# Patient Record
Sex: Female | Born: 1965 | Race: White | Hispanic: No | Marital: Married | Smoking: Never smoker
Health system: Southern US, Community
[De-identification: ages and names within clinical notes are randomized; demographics above are authoritative.]

## PROBLEM LIST (undated history)

## (undated) DIAGNOSIS — E079 Disorder of thyroid, unspecified: Secondary | ICD-10-CM

---

## 2000-07-10 ENCOUNTER — Other Ambulatory Visit: Admission: RE | Admit: 2000-07-10 | Discharge: 2000-07-10 | Payer: Self-pay | Admitting: Obstetrics and Gynecology

## 2000-08-09 ENCOUNTER — Encounter: Payer: Self-pay | Admitting: Obstetrics and Gynecology

## 2000-08-09 ENCOUNTER — Ambulatory Visit (HOSPITAL_COMMUNITY): Admission: RE | Admit: 2000-08-09 | Discharge: 2000-08-09 | Payer: Self-pay | Admitting: Obstetrics and Gynecology

## 2001-08-26 ENCOUNTER — Other Ambulatory Visit: Admission: RE | Admit: 2001-08-26 | Discharge: 2001-08-26 | Payer: Self-pay | Admitting: Gynecology

## 2001-09-12 ENCOUNTER — Ambulatory Visit (HOSPITAL_COMMUNITY): Admission: RE | Admit: 2001-09-12 | Discharge: 2001-09-12 | Payer: Self-pay | Admitting: Gynecology

## 2001-09-12 ENCOUNTER — Encounter: Payer: Self-pay | Admitting: Gynecology

## 2002-03-27 ENCOUNTER — Encounter: Payer: Self-pay | Admitting: Family Medicine

## 2002-03-27 ENCOUNTER — Encounter: Admission: RE | Admit: 2002-03-27 | Discharge: 2002-03-27 | Payer: Self-pay | Admitting: Family Medicine

## 2002-10-01 ENCOUNTER — Encounter: Payer: Self-pay | Admitting: Family Medicine

## 2002-10-01 ENCOUNTER — Encounter: Admission: RE | Admit: 2002-10-01 | Discharge: 2002-10-01 | Payer: Self-pay | Admitting: Family Medicine

## 2003-04-01 ENCOUNTER — Inpatient Hospital Stay (HOSPITAL_COMMUNITY): Admission: AD | Admit: 2003-04-01 | Discharge: 2003-04-01 | Payer: Self-pay | Admitting: Gynecology

## 2004-01-03 ENCOUNTER — Other Ambulatory Visit: Admission: RE | Admit: 2004-01-03 | Discharge: 2004-01-03 | Payer: Self-pay | Admitting: Gynecology

## 2005-07-04 ENCOUNTER — Other Ambulatory Visit: Admission: RE | Admit: 2005-07-04 | Discharge: 2005-07-04 | Payer: Self-pay | Admitting: Gynecology

## 2006-12-10 ENCOUNTER — Other Ambulatory Visit: Admission: RE | Admit: 2006-12-10 | Discharge: 2006-12-10 | Payer: Self-pay | Admitting: Gynecology

## 2007-12-04 HISTORY — PX: SALPINGECTOMY: SHX328

## 2008-02-09 ENCOUNTER — Other Ambulatory Visit: Admission: RE | Admit: 2008-02-09 | Discharge: 2008-02-09 | Payer: Self-pay | Admitting: *Deleted

## 2008-05-04 ENCOUNTER — Encounter (INDEPENDENT_AMBULATORY_CARE_PROVIDER_SITE_OTHER): Payer: Self-pay | Admitting: Obstetrics and Gynecology

## 2008-05-04 ENCOUNTER — Observation Stay (HOSPITAL_COMMUNITY): Admission: AD | Admit: 2008-05-04 | Discharge: 2008-05-06 | Payer: Self-pay | Admitting: Obstetrics and Gynecology

## 2009-04-09 ENCOUNTER — Inpatient Hospital Stay (HOSPITAL_COMMUNITY): Admission: AD | Admit: 2009-04-09 | Discharge: 2009-04-10 | Payer: Self-pay | Admitting: Obstetrics and Gynecology

## 2010-12-24 ENCOUNTER — Encounter: Payer: Self-pay | Admitting: Gynecology

## 2011-03-13 LAB — CBC
HCT: 29.4 % — ABNORMAL LOW (ref 36.0–46.0)
Hemoglobin: 10.2 g/dL — ABNORMAL LOW (ref 12.0–15.0)
MCHC: 34.8 g/dL (ref 30.0–36.0)
MCV: 92.6 fL (ref 78.0–100.0)
Platelets: 233 10*3/uL (ref 150–400)
RBC: 3.18 MIL/uL — ABNORMAL LOW (ref 3.87–5.11)
RDW: 14.4 % (ref 11.5–15.5)
WBC: 14.7 10*3/uL — ABNORMAL HIGH (ref 4.0–10.5)

## 2011-03-13 LAB — RPR: RPR Ser Ql: NONREACTIVE

## 2011-04-17 NOTE — Op Note (Signed)
Brittney Hogan, Brittney Hogan                ACCOUNT NO.:  [id number]   MEDICAL RECORD NO.:  [id number]          PATIENT TYPE:  OBV   LOCATION:  9306                          FACILITY:  [facility]   PHYSICIAN:  Miguel Aschoff, M.D.       DATE OF BIRTH:  1966/06/20   DATE OF PROCEDURE:  05/04/2008  DATE OF DISCHARGE:                               OPERATIVE REPORT   PREOPERATIVE DIAGNOSIS:  Hemoperitoneum, left ectopic pregnancy.   POSTOPERATIVE DIAGNOSIS:  Hemoperitoneum, left ectopic pregnancy.   PROCEDURE:  Laparoscopy with left partial salpingectomy.   SURGEON:  Miguel Aschoff, MD   ANESTHESIA:  General.   COMPLICATIONS:  None.   JUSTIFICATION:  The patient is a 45 year old white female gravida 8,  para 0-0-7 by Dr. Chevis Pretty who was being evaluated for possible ectopic  pregnancy.  On evaluation by Dr. Chevis Pretty, the patient was found to have a  mass in the left adnexa consistent with an ectopic pregnancy.  In  addition, she was noted to have free fluid in the abdomen consistent  with hemoperitoneum and signs of symptoms consistent with leaking  ectopic pregnancy.  Because of this, she was transferred to the triage  area at [facility] to undergo laparoscopy and laparotomy, if  indicated.  The risks and benefits of the procedure were discussed with  the patient including possibility of laparotomy and loss of the left  tube.  Informed consent has been obtained.   PROCEDURE IN DETAIL:  The patient was taken to the operating placed in  the supine position.  General anesthesia was administered without  difficulty.  She was then placed in dorsal lithotomy position, prepped  and draped in the usual sterile fashion.  A Foley catheter was inserted.  Prior to inserting Foley catheter, examination under anesthesia revealed  normal external genitalia, normal Bartholin Skene glands, and normal  urethra.  The vaginal vault was without gross lesion.  The cervix was  without gross lesion.  Uterus was noted be normal  size and shape and  anterior.  The adnexa revealed no definite masses.  There was a  fullness, however, noted on the left.  Once this was done, the speculum  was placed in the vaginal vault.  A Hulka tenaculum placed through the  cervix and held.  Attention was then directed to the umbilicus where a  small infraumbilical incision was made.  Veress needle was then inserted  and the abdomen was insufflated with 3 L of CO2 under monitored  pressure.  After the insufflation, trocar to the laparoscope was placed  followed by laparoscope itself.  It was obvious upon entering the  abdomen that there was free blood in the abdominal cavity and the  pelvis.  Approximately, 200 mL of blood were found.  Inspection revealed  the uterus to be anterior, normal size, and shape.  The right tube was  normal along its course, then removed fine and delicate except for a  small peritubal cyst noted on the right side approximately 4 mm in size.  The right ovary was noted to be within normal limits.  On  the left side,  there was a fusiform mass noted in the distal one-third of the left  tube, which was oozing blood.  In addition, there were adhesions of the  appendices epiploica to the left ovary.  At this point, 2 accessory  ports were established under direct visualization.  A 5-mm port was  established in the right lower quadrant and a 10-mm port in the left  lower quadrant.  At this point, the ectopic pregnancy was grasped, and  then using a gyrus unit, the tube was transected and then dissected  along the mesosalpinx, excising the ectopic pregnancy in the distal  portion of the left tube.  This was done with an excellent hemostasis  and the specimen freed without difficulty.  At this point, an EndoCatch  unit was placed into the abdomen and the ectopic pregnancy placed into  the EndoCatch and brought up to the peritoneal surfaces and in the left  lower quadrant.  The specimen was then held in this location  while the  abdomen was irrigated with saline to evacuate the blood that has been  present, secondary to the ectopic pregnancy.  This was done without  difficulty and the vast majority of all the intraperitoneal blood was  removed without difficulty.   Final inspection was then made for hemostasis.  Hemostasis appeared to  be excellent.  At this point, the ectopic pregnancy was brought through  the left lower quadrant incision without difficulty.  At this point, CO2  was allowed to escape.  The small incisions were then closed using  subcuticular 4-0 Vicryl.  The port sites were then injected with 0.25%  Marcaine.  A total of 12 mL was used and then a subcuticular 4-0 Vicryl  closure, closing the small defects, and Steri-Strips were applied.  The  total estimated blood loss from the both the ectopic pregnancy and the  surgery was less than 200 mL.  There was minimal blood loss from the  surgical procedure itself.  At this point, the patient was reversed from  the anesthetic and taken to the recovery room in satisfactory condition.   The plan is for the patient be observed overnight and if stable on the  morning of May 05, 2008, the patient will be discharged to home.      Miguel Aschoff, M.D.  Electronically Signed     AR/MEDQ  D:  05/04/2008  T:  05/05/2008  Job:  [id number]   cc:   Leatha Gilding. Mezer, M.D.  Fax: [phone]

## 2011-04-17 NOTE — Discharge Summary (Signed)
Brittney Hogan, Brittney Hogan                ACCOUNT NO.:  [id number]   MEDICAL RECORD NO.:  [id number]          PATIENT TYPE:  INP   LOCATION:  9124                          FACILITY:  [facility]   PHYSICIAN:  Sherron Monday, MD        DATE OF BIRTH:  Jul 11, 1966   DATE OF ADMISSION:  04/09/2009  DATE OF DISCHARGE:  04/10/2009                               DISCHARGE SUMMARY   ADMITTING DIAGNOSIS:  Intrauterine pregnancy at term in active labor.   DISCHARGE DIAGNOSIS:  Intrauterine pregnancy at term in active labor,  delivered via spontaneous vaginal delivery.   HISTORY OF PRESENT ILLNESS:  A 45 year old G7, P 0-0-6-0 at 1 and 0  weeks, who presents in active labor.  On presentation to labor and  delivery she was complete, complete and 0 station.  Labor commenced at 2-  3:00 a.m. with regular painful contractions.  She had rupture of  membranes at 4:00 a.m. for clear fluid.  She had good fetal movement.  Her pregnancy was complicated by AMA and first trimester screen with  elevated Down syndrome risk of 1 in 190.   PAST MEDICAL HISTORY:  Not  Significant   PAST SURGICAL HISTORY:  Significant for salpingectomy.   PAST OBSTETRICS AND GYNECOLOGIC HISTORY:  G1 was an ectopic pregnancy  treated with methotrexate.  G2 through G5 were first trimester,  miscarriages.  G6, left ectopic treated with a salpingectomy.  G7 is the  present pregnancy.  No history of any abnormal Pap smears or sexually  transmitted diseases.   MEDICATIONS:  Prenatal vitamins.   ALLERGIES:  SULFA and STADOL.   SOCIAL HISTORY:  Denies alcohol, tobacco, or drug use.  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   FAMILY HISTORY:  Significant for cardiac arrhythmia in a paternal uncle  and a brother.   PRENATAL LABORATORY FINDINGS:  Hemoglobin 12.1, platelets 287,000, O  positive, antibody screen negative, Pap smear is in normal limits,  gonorrhea negative, chlamydia negative, RPR nonreactive, rubella immune,  cystic  fibrosis screen negative, hepatitis B surface antigen negative,  HIV negative, hemoglobin A1c of 5.8, first trimester screen with an  increased risk of Down syndrome, AFP within normal limits, early Glucola  was 106, Glucola at 28 weeks was 164 with a followup GTT of 92, 189, 152  and 120.  AFP as previously mentioned within normal limits.  Her group B  strep was resolved for not back yet.  Ultrasound performed on November  12 plus weeks revealed normal nuchal fold.  On November 19, 2008,  confirming [facility] of Apr 24, 2009, 17 plus 6 weeks, revealed normal  anatomy, anterior placenta.   On physical exam, afebrile.  Vital signs stable on presentation, a  benign exam.  Fetal heart tones in the 130s and reassuring with  contractions every 45 minutes.  On my arrival, the exam was 10 cm  dilated by 100% effaced and +2 station.  As her GBS was unknown and she  was progressing rapidly, a delivery was planned.  She pushed for  approximately 20 minutes to deliver a  viable female infant, ROA  presentation at 6:46 a.m. with Apgars of 9 at 1 minute and 10 at 5  minutes, and a weight of 6 pounds 14 ounces.  Placenta was expressed  intact at 6:56.  OB lacerations were periclitoral although hemostatic,  and a second-degree perineal repair with 3-0 Vicryl in a typical fashion  with a local block.  EBL was less than 500 mL.  On postpartum day #1,  the patient requested discharge to home.  At this time, she had no  complaints.  Her pain was well controlled.  She had normal lochia, was  tolerating p.o. and ambulating well.  Her postpartum hemoglobin was  10.2.  Her abdomen was soft and her fundus was firm and nontender.  She  was discharged home with routine discharge instructions and numbers to  call if any questions or problems.  She was discharged with  prescriptions for Motrin, Darvocet, and prenatal vitamins.  She will  follow up in 6 weeks.   DISCHARGE INFORMATION:  She is 0 positive, rubella immune.   Plans to  breast feed.  We will discuss contraception at her 6-week visit and her  postpartum hemoglobin was 10.2.      Sherron Monday, MD  Electronically Signed     JB/MEDQ  D:  04/10/2009  T:  04/10/2009  Job:  [id number]

## 2011-04-20 NOTE — Discharge Summary (Signed)
Brittney Hogan, Brittney Hogan                ACCOUNT NO.:  [id number]   MEDICAL RECORD NO.:  [id number]          PATIENT TYPE:  OBV   LOCATION:  9306                          [facility]ACILITY[facility]   PHYSICIAN:  Miguel Aschoff, M.D.       DATE OF BIRTH:  [birth year]   DATE OF ADMISSION:  05/04/2008  DATE OF DISCHARGE:  05/06/2008                               DISCHARGE SUMMARY   ADMISSION DIAGNOSIS:  Ectopic pregnancy.   FINAL DIAGNOSIS:  Ectopic pregnancy.   OPERATIONS AND PROCEDURE:  Laparoscopy with partial left salpingectomy.   ANESTHESIA:  General anesthesia.   BRIEF HISTORY:  The patient is a 45 year old white female gravida 8,  para 0-0-7-0 was seen by Dr. Chevis Pretty and found to have an ectopic  pregnancy.  She was referred for treatment to the [facility]  office, at which time, she was noted to have signs of a hemoperitoneum  and was transferred to [facility] to undergo laparoscopy and  laparotomy if indicated.   HOSPITAL COURSE:  Preoperative studies were obtained.  This revealed an  admission hemoglobin of 11.8 and white count of 6600.  Chemistry profile  showed slight elevation of glucose of 123.   HOSPITAL COURSE:  Under general anesthesia, a laparoscopy was carried  out, at which time she was noted to have a left ectopic pregnancy which  was actively bleeding at one-third of her left fallopian tube.  At this  point, the laparoscopic partial salpingectomy was carried out and the  specimen was excised.  The patient tolerated the surgery and procedure  well.  On the first postoperative day, the patient did have significant  nausea and was continued in the hospital for 1 additional day on IV  therapy until the nausea resolved.  She was then discharged home on  05/06/2008, in a satisfactory condition.   Medications for home include Darvocet-N 100, 1 q.4 h. as needed for  pain.  She was instructed to do no heavy lifting, place nothing in the  vagina for 2 weeks and to call for any  problems such as fever, pain, or  heavy bleeding or issues with her incision.  She is to be seen in the  office for 4 weeks for followup examination.  1      Miguel Aschoff, M.D.  Electronically Signed     AR/MEDQ  D:  06/03/2008  T:  06/03/2008  Job:  [id number]

## 2011-08-30 LAB — CBC
HCT: 29.1 — ABNORMAL LOW
HCT: 32.9 — ABNORMAL LOW
HCT: 34.7 — ABNORMAL LOW
Hemoglobin: 10.3 — ABNORMAL LOW
Hemoglobin: 11.2 — ABNORMAL LOW
Hemoglobin: 11.8 — ABNORMAL LOW
MCHC: 34
MCHC: 34
MCHC: 35.2
MCV: 89.1
MCV: 89.7
MCV: 90.6
Platelets: 255
Platelets: 336
Platelets: 339
RBC: 3.27 — ABNORMAL LOW
RBC: 3.64 — ABNORMAL LOW
RBC: 3.86 — ABNORMAL LOW
RDW: 13.1
RDW: 13.2
RDW: 13.2
WBC: 11.7 — ABNORMAL HIGH
WBC: 6.6
WBC: 8.9

## 2011-08-30 LAB — COMPREHENSIVE METABOLIC PANEL
ALT: 15
AST: 21
Albumin: 3.3 — ABNORMAL LOW
Alkaline Phosphatase: 31 — ABNORMAL LOW
BUN: 7
CO2: 29
Calcium: 8.2 — ABNORMAL LOW
Chloride: 101
Creatinine, Ser: 1.02
GFR calc Af Amer: >60
GFR calc non Af Amer: 59 — ABNORMAL LOW
Glucose, Bld: 123 — ABNORMAL HIGH
Potassium: 3.6
Sodium: 140
Total Bilirubin: 1.1
Total Protein: 5.8 — ABNORMAL LOW

## 2011-08-30 LAB — ABO/RH: ABO/RH(D): O POS

## 2011-08-30 LAB — TYPE AND SCREEN
ABO/RH(D): O POS
Antibody Screen: NEGATIVE

## 2015-06-02 ENCOUNTER — Other Ambulatory Visit (HOSPITAL_COMMUNITY): Payer: Self-pay | Admitting: Chiropractic Medicine

## 2015-06-02 ENCOUNTER — Ambulatory Visit (HOSPITAL_COMMUNITY)
Admission: RE | Admit: 2015-06-02 | Discharge: 2015-06-02 | Disposition: A | Discharge status: Home / Self Care | Payer: BLUE CROSS/BLUE SHIELD | Source: Ambulatory Visit | Attending: Chiropractic Medicine | Admitting: Chiropractic Medicine

## 2015-06-02 DIAGNOSIS — M542 Cervicalgia: Secondary | ICD-10-CM | POA: Insufficient documentation

## 2015-06-02 DIAGNOSIS — M25512 Pain in left shoulder: Secondary | ICD-10-CM

## 2016-12-14 ENCOUNTER — Other Ambulatory Visit: Payer: Self-pay | Admitting: Physician Assistant

## 2016-12-14 DIAGNOSIS — Z1231 Encounter for screening mammogram for malignant neoplasm of breast: Secondary | ICD-10-CM

## 2016-12-28 ENCOUNTER — Ambulatory Visit
Admission: RE | Admit: 2016-12-28 | Discharge: 2016-12-28 | Disposition: A | Discharge status: Home / Self Care | Payer: BLUE CROSS/BLUE SHIELD | Source: Ambulatory Visit | Attending: Physician Assistant | Admitting: Physician Assistant

## 2016-12-28 DIAGNOSIS — Z1231 Encounter for screening mammogram for malignant neoplasm of breast: Secondary | ICD-10-CM

## 2016-12-31 ENCOUNTER — Other Ambulatory Visit: Payer: Self-pay | Admitting: Physician Assistant

## 2016-12-31 DIAGNOSIS — R928 Other abnormal and inconclusive findings on diagnostic imaging of breast: Secondary | ICD-10-CM

## 2017-04-16 ENCOUNTER — Other Ambulatory Visit: Payer: BLUE CROSS/BLUE SHIELD

## 2017-04-19 ENCOUNTER — Ambulatory Visit
Admission: RE | Admit: 2017-04-19 | Discharge: 2017-04-19 | Disposition: A | Discharge status: Home / Self Care | Payer: BLUE CROSS/BLUE SHIELD | Source: Ambulatory Visit | Attending: Physician Assistant | Admitting: Physician Assistant

## 2017-04-19 DIAGNOSIS — R928 Other abnormal and inconclusive findings on diagnostic imaging of breast: Secondary | ICD-10-CM

## 2020-01-29 DIAGNOSIS — E063 Autoimmune thyroiditis: Secondary | ICD-10-CM | POA: Diagnosis not present

## 2020-01-29 DIAGNOSIS — E782 Mixed hyperlipidemia: Secondary | ICD-10-CM | POA: Diagnosis not present

## 2020-01-29 DIAGNOSIS — E8881 Metabolic syndrome: Secondary | ICD-10-CM | POA: Diagnosis not present

## 2020-01-29 DIAGNOSIS — E7212 Methylenetetrahydrofolate reductase deficiency: Secondary | ICD-10-CM | POA: Diagnosis not present

## 2020-01-29 DIAGNOSIS — R5383 Other fatigue: Secondary | ICD-10-CM | POA: Diagnosis not present

## 2020-01-29 DIAGNOSIS — E039 Hypothyroidism, unspecified: Secondary | ICD-10-CM | POA: Diagnosis not present

## 2020-01-29 DIAGNOSIS — E559 Vitamin D deficiency, unspecified: Secondary | ICD-10-CM | POA: Diagnosis not present

## 2020-05-26 DIAGNOSIS — N951 Menopausal and female climacteric states: Secondary | ICD-10-CM | POA: Diagnosis not present

## 2020-05-26 DIAGNOSIS — E279 Disorder of adrenal gland, unspecified: Secondary | ICD-10-CM | POA: Diagnosis not present

## 2020-05-26 DIAGNOSIS — E039 Hypothyroidism, unspecified: Secondary | ICD-10-CM | POA: Diagnosis not present

## 2020-05-26 DIAGNOSIS — E782 Mixed hyperlipidemia: Secondary | ICD-10-CM | POA: Diagnosis not present

## 2020-05-26 DIAGNOSIS — E8881 Metabolic syndrome: Secondary | ICD-10-CM | POA: Diagnosis not present

## 2020-05-26 DIAGNOSIS — R7301 Impaired fasting glucose: Secondary | ICD-10-CM | POA: Diagnosis not present

## 2020-07-19 ENCOUNTER — Ambulatory Visit (INDEPENDENT_AMBULATORY_CARE_PROVIDER_SITE_OTHER): Payer: BC Managed Care – PPO | Admitting: Sports Medicine

## 2020-07-19 ENCOUNTER — Other Ambulatory Visit: Payer: Self-pay

## 2020-07-19 ENCOUNTER — Ambulatory Visit
Admission: RE | Admit: 2020-07-19 | Discharge: 2020-07-19 | Disposition: A | Discharge status: Home / Self Care | Payer: BC Managed Care – PPO | Source: Ambulatory Visit | Attending: Sports Medicine | Admitting: Sports Medicine

## 2020-07-19 VITALS — BP 124/82 | Ht 62.0 in | Wt 170.0 lb

## 2020-07-19 DIAGNOSIS — M5412 Radiculopathy, cervical region: Secondary | ICD-10-CM

## 2020-07-19 DIAGNOSIS — R202 Paresthesia of skin: Secondary | ICD-10-CM

## 2020-07-19 DIAGNOSIS — M25512 Pain in left shoulder: Secondary | ICD-10-CM | POA: Diagnosis not present

## 2020-07-19 DIAGNOSIS — R531 Weakness: Secondary | ICD-10-CM | POA: Diagnosis not present

## 2020-07-19 DIAGNOSIS — M542 Cervicalgia: Secondary | ICD-10-CM | POA: Diagnosis not present

## 2020-07-19 MED ORDER — DICLOFENAC SODIUM 75 MG PO TBEC: 75.0000 mg | DELAYED_RELEASE_TABLET | Freq: Two times a day (BID) | ORAL | 0 refills | Status: DC | PRN
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
## 2020-07-20 ENCOUNTER — Encounter: Payer: Self-pay | Admitting: Sports Medicine

## 2020-07-26 ENCOUNTER — Other Ambulatory Visit: Payer: Self-pay

## 2020-07-26 ENCOUNTER — Ambulatory Visit (INDEPENDENT_AMBULATORY_CARE_PROVIDER_SITE_OTHER): Payer: BC Managed Care – PPO | Admitting: Sports Medicine

## 2020-07-26 VITALS — BP 116/74 | Ht 62.0 in | Wt 170.0 lb

## 2020-07-26 DIAGNOSIS — M25512 Pain in left shoulder: Secondary | ICD-10-CM
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
## 2020-07-27 NOTE — Addendum Note (Signed)
Addended by: Reino Bellis R on: 07/27/2020 09:11 AM   Modules accepted: Level of Service

## 2020-08-04 DIAGNOSIS — Z79890 Hormone replacement therapy (postmenopausal): Secondary | ICD-10-CM | POA: Diagnosis not present

## 2020-08-04 DIAGNOSIS — E559 Vitamin D deficiency, unspecified: Secondary | ICD-10-CM | POA: Diagnosis not present

## 2020-08-04 DIAGNOSIS — R7303 Prediabetes: Secondary | ICD-10-CM | POA: Diagnosis not present

## 2020-08-04 DIAGNOSIS — E039 Hypothyroidism, unspecified: Secondary | ICD-10-CM | POA: Diagnosis not present

## 2020-08-04 DIAGNOSIS — R7982 Elevated C-reactive protein (CRP): Secondary | ICD-10-CM | POA: Diagnosis not present

## 2020-08-04 DIAGNOSIS — R5383 Other fatigue: Secondary | ICD-10-CM | POA: Diagnosis not present

## 2020-08-04 DIAGNOSIS — R635 Abnormal weight gain: Secondary | ICD-10-CM | POA: Diagnosis not present

## 2020-08-04 DIAGNOSIS — E782 Mixed hyperlipidemia: Secondary | ICD-10-CM | POA: Diagnosis not present

## 2020-08-04 DIAGNOSIS — M2550 Pain in unspecified joint: Secondary | ICD-10-CM | POA: Diagnosis not present

## 2020-08-09 DIAGNOSIS — M25512 Pain in left shoulder: Secondary | ICD-10-CM | POA: Diagnosis not present

## 2020-08-09 DIAGNOSIS — M67814 Other specified disorders of tendon, left shoulder: Secondary | ICD-10-CM | POA: Diagnosis not present

## 2020-08-10 ENCOUNTER — Ambulatory Visit: Payer: BC Managed Care – PPO | Admitting: Physical Therapy

## 2020-08-11 DIAGNOSIS — M67814 Other specified disorders of tendon, left shoulder: Secondary | ICD-10-CM | POA: Diagnosis not present

## 2020-08-11 DIAGNOSIS — M25512 Pain in left shoulder: Secondary | ICD-10-CM | POA: Diagnosis not present

## 2020-08-16 DIAGNOSIS — M25512 Pain in left shoulder: Secondary | ICD-10-CM | POA: Diagnosis not present

## 2020-08-16 DIAGNOSIS — M67814 Other specified disorders of tendon, left shoulder: Secondary | ICD-10-CM | POA: Diagnosis not present

## 2020-08-18 DIAGNOSIS — M25512 Pain in left shoulder: Secondary | ICD-10-CM | POA: Diagnosis not present

## 2020-08-18 DIAGNOSIS — M67814 Other specified disorders of tendon, left shoulder: Secondary | ICD-10-CM | POA: Diagnosis not present

## 2020-08-22 DIAGNOSIS — M25512 Pain in left shoulder: Secondary | ICD-10-CM | POA: Diagnosis not present

## 2020-08-22 DIAGNOSIS — M67814 Other specified disorders of tendon, left shoulder: Secondary | ICD-10-CM | POA: Diagnosis not present

## 2020-08-23 ENCOUNTER — Ambulatory Visit (INDEPENDENT_AMBULATORY_CARE_PROVIDER_SITE_OTHER): Payer: BC Managed Care – PPO | Admitting: Sports Medicine

## 2020-08-23 ENCOUNTER — Other Ambulatory Visit: Payer: Self-pay

## 2020-08-23 VITALS — BP 128/84 | Ht 62.0 in | Wt 168.0 lb

## 2020-08-23 DIAGNOSIS — M791 Myalgia, unspecified site: Secondary | ICD-10-CM | POA: Diagnosis not present

## 2020-08-23 DIAGNOSIS — M25512 Pain in left shoulder: Secondary | ICD-10-CM | POA: Diagnosis not present

## 2020-08-24 LAB — SEDIMENTATION RATE: Sed Rate: 40 mm/hr (ref 0–40)

## 2020-08-24 NOTE — Progress Notes (Signed)
   Subjective:    Patient ID: Brittney Hogan, female    DOB: [birth year], 54 y.o.   MRN: [id number]  HPI   Patient comes in today for follow-up on chronic left shoulder pain.  She has been working with physical therapy and making some improvement.  She has also been working with a Land.  He has ordered an MRI of her left shoulder.  Although she has noticed improvement with physical therapy, her pain is still quite severe.  She is also complaining of diffuse myalgias throughout her body.  She recently had some blood work done and those results are available through epic.  She has mild hypercholesterolemia as well as some insulin resistance.  She is aware of these facts.  Main reason for the blood work was to make sure that she did not have a systemic reason for her myalgias.    Review of Systems As above    Objective:   Physical Exam  Well-developed, well-nourished.  Left shoulder: Patient has nearly full active range of motion.  Rotator cuff strength is 5/5 but she does have reproducible pain with resisted supraspinatus.  Neurovascularly intact distally.       Assessment & Plan:   Persistent chronic left shoulder pain-rule out rotator cuff tear  Previous bedside ultrasound showed tendinopathy of the supraspinatus tendon.  Although she has made some initial improvement with physical therapy, I agree with the MRI that was ordered by her chiropractor.  We will delineate further treatment based on those findings.  In the meantime, I did check a sed rate today which was within normal limits so suspicion of a systemic etiology for her myalgias is low.  Of note, she is also continuing to complain of bilateral hand numbness and tingling.  She has an appointment with neurology next month.

## 2020-08-25 DIAGNOSIS — M25512 Pain in left shoulder: Secondary | ICD-10-CM | POA: Diagnosis not present

## 2020-08-25 DIAGNOSIS — M67814 Other specified disorders of tendon, left shoulder: Secondary | ICD-10-CM | POA: Diagnosis not present

## 2020-08-30 ENCOUNTER — Encounter: Payer: Self-pay | Admitting: Sports Medicine

## 2020-08-30 DIAGNOSIS — M25512 Pain in left shoulder: Secondary | ICD-10-CM | POA: Diagnosis not present

## 2020-08-30 DIAGNOSIS — M67814 Other specified disorders of tendon, left shoulder: Secondary | ICD-10-CM | POA: Diagnosis not present

## 2020-09-01 DIAGNOSIS — M25512 Pain in left shoulder: Secondary | ICD-10-CM | POA: Diagnosis not present

## 2020-09-01 DIAGNOSIS — M67814 Other specified disorders of tendon, left shoulder: Secondary | ICD-10-CM | POA: Diagnosis not present

## 2020-09-06 DIAGNOSIS — M67814 Other specified disorders of tendon, left shoulder: Secondary | ICD-10-CM | POA: Diagnosis not present

## 2020-09-06 DIAGNOSIS — M25512 Pain in left shoulder: Secondary | ICD-10-CM | POA: Diagnosis not present

## 2020-09-08 DIAGNOSIS — M25512 Pain in left shoulder: Secondary | ICD-10-CM | POA: Diagnosis not present

## 2020-09-08 DIAGNOSIS — M67814 Other specified disorders of tendon, left shoulder: Secondary | ICD-10-CM | POA: Diagnosis not present

## 2020-09-13 ENCOUNTER — Other Ambulatory Visit: Payer: Self-pay

## 2020-09-13 DIAGNOSIS — M67814 Other specified disorders of tendon, left shoulder: Secondary | ICD-10-CM | POA: Diagnosis not present

## 2020-09-13 DIAGNOSIS — M25512 Pain in left shoulder: Secondary | ICD-10-CM | POA: Diagnosis not present

## 2020-09-13 MED ORDER — DICLOFENAC SODIUM 75 MG PO TBEC: 75.0000 mg | DELAYED_RELEASE_TABLET | Freq: Two times a day (BID) | ORAL | 0 refills | Status: DC | PRN

## 2020-09-13 NOTE — Progress Notes (Signed)
Pt called asking for refill on diclofenac (VOLTAREN) 75 MG EC tablet.

## 2020-09-15 DIAGNOSIS — M67814 Other specified disorders of tendon, left shoulder: Secondary | ICD-10-CM | POA: Diagnosis not present

## 2020-09-15 DIAGNOSIS — M25512 Pain in left shoulder: Secondary | ICD-10-CM | POA: Diagnosis not present

## 2020-09-20 DIAGNOSIS — M67814 Other specified disorders of tendon, left shoulder: Secondary | ICD-10-CM | POA: Diagnosis not present

## 2020-09-20 DIAGNOSIS — M25512 Pain in left shoulder: Secondary | ICD-10-CM | POA: Diagnosis not present

## 2020-09-22 DIAGNOSIS — M67814 Other specified disorders of tendon, left shoulder: Secondary | ICD-10-CM | POA: Diagnosis not present

## 2020-09-22 DIAGNOSIS — M25512 Pain in left shoulder: Secondary | ICD-10-CM | POA: Diagnosis not present

## 2020-09-26 DIAGNOSIS — M25512 Pain in left shoulder: Secondary | ICD-10-CM | POA: Diagnosis not present

## 2020-09-26 DIAGNOSIS — M67814 Other specified disorders of tendon, left shoulder: Secondary | ICD-10-CM | POA: Diagnosis not present

## 2020-09-27 ENCOUNTER — Ambulatory Visit (INDEPENDENT_AMBULATORY_CARE_PROVIDER_SITE_OTHER): Payer: BC Managed Care – PPO | Admitting: Neurology

## 2020-09-27 ENCOUNTER — Other Ambulatory Visit: Payer: Self-pay

## 2020-09-27 ENCOUNTER — Encounter: Payer: Self-pay | Admitting: Neurology

## 2020-09-27 VITALS — BP 120/80 | HR 76 | Ht 62.0 in | Wt 164.5 lb

## 2020-09-27 DIAGNOSIS — M79602 Pain in left arm: Secondary | ICD-10-CM | POA: Diagnosis not present

## 2020-09-27 DIAGNOSIS — M25512 Pain in left shoulder: Secondary | ICD-10-CM

## 2020-09-27 DIAGNOSIS — R200 Anesthesia of skin: Secondary | ICD-10-CM | POA: Insufficient documentation

## 2020-09-27 DIAGNOSIS — M542 Cervicalgia: Secondary | ICD-10-CM | POA: Insufficient documentation

## 2020-09-27 MED ORDER — METHYLPREDNISOLONE 4 MG PO TABS: ORAL_TABLET | ORAL | 0 refills | Status: DC
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
## 2020-09-29 DIAGNOSIS — M25512 Pain in left shoulder: Secondary | ICD-10-CM | POA: Diagnosis not present

## 2020-09-29 DIAGNOSIS — M67814 Other specified disorders of tendon, left shoulder: Secondary | ICD-10-CM | POA: Diagnosis not present

## 2020-10-05 DIAGNOSIS — M25512 Pain in left shoulder: Secondary | ICD-10-CM | POA: Diagnosis not present

## 2020-10-05 DIAGNOSIS — M67814 Other specified disorders of tendon, left shoulder: Secondary | ICD-10-CM | POA: Diagnosis not present

## 2020-10-06 DIAGNOSIS — M67814 Other specified disorders of tendon, left shoulder: Secondary | ICD-10-CM | POA: Diagnosis not present

## 2020-10-06 DIAGNOSIS — M25512 Pain in left shoulder: Secondary | ICD-10-CM | POA: Diagnosis not present

## 2020-10-07 DIAGNOSIS — M2560 Stiffness of unspecified joint, not elsewhere classified: Secondary | ICD-10-CM | POA: Diagnosis not present

## 2020-10-07 DIAGNOSIS — M47812 Spondylosis without myelopathy or radiculopathy, cervical region: Secondary | ICD-10-CM | POA: Diagnosis not present

## 2020-10-07 DIAGNOSIS — M47813 Spondylosis without myelopathy or radiculopathy, cervicothoracic region: Secondary | ICD-10-CM | POA: Diagnosis not present

## 2020-10-07 DIAGNOSIS — M25512 Pain in left shoulder: Secondary | ICD-10-CM | POA: Diagnosis not present

## 2020-10-07 DIAGNOSIS — M50222 Other cervical disc displacement at C5-C6 level: Secondary | ICD-10-CM | POA: Diagnosis not present

## 2020-10-07 DIAGNOSIS — M4802 Spinal stenosis, cervical region: Secondary | ICD-10-CM | POA: Diagnosis not present

## 2020-10-10 DIAGNOSIS — M67814 Other specified disorders of tendon, left shoulder: Secondary | ICD-10-CM | POA: Diagnosis not present

## 2020-10-10 DIAGNOSIS — M25512 Pain in left shoulder: Secondary | ICD-10-CM | POA: Diagnosis not present

## 2020-10-11 ENCOUNTER — Ambulatory Visit (INDEPENDENT_AMBULATORY_CARE_PROVIDER_SITE_OTHER): Payer: BC Managed Care – PPO | Admitting: Neurology

## 2020-10-11 ENCOUNTER — Encounter (INDEPENDENT_AMBULATORY_CARE_PROVIDER_SITE_OTHER): Payer: BC Managed Care – PPO | Admitting: Neurology

## 2020-10-11 ENCOUNTER — Encounter: Payer: Self-pay | Admitting: Neurology

## 2020-10-11 DIAGNOSIS — Z0289 Encounter for other administrative examinations: Secondary | ICD-10-CM

## 2020-10-11 DIAGNOSIS — R200 Anesthesia of skin: Secondary | ICD-10-CM | POA: Diagnosis not present

## 2020-10-11 DIAGNOSIS — M25512 Pain in left shoulder: Secondary | ICD-10-CM

## 2020-10-11 DIAGNOSIS — M79602 Pain in left arm: Secondary | ICD-10-CM | POA: Diagnosis not present

## 2020-10-11 DIAGNOSIS — M542 Cervicalgia: Secondary | ICD-10-CM | POA: Diagnosis not present

## 2020-10-11 DIAGNOSIS — G5603 Carpal tunnel syndrome, bilateral upper limbs: Secondary | ICD-10-CM

## 2020-10-11 MED ORDER — GABAPENTIN 300 MG PO CAPS: 300.0000 mg | ORAL_CAPSULE | Freq: Three times a day (TID) | ORAL | 11 refills | Status: AC

## 2020-10-11 NOTE — Progress Notes (Signed)
Full Name:Brittney Hogan Gender: Female MRN #: [id number] Date of Birth: Apr 25, 1966    Visit Date: 10/11/2020 07:21 Age: 54 Years Examining Physician: Despina Arias, MD  Referring Physician: Despina Arias, MD Height: 5 feet 2 inch    Weight: 64lbs   History: Brittney Hogan is a 54 year old woman with left shoulder pain and pain and numbness that radiates down the left arm, towards the third fourth and fifth fingers.  She also has pain and numbness in the right hand and wrist, which will sometimes awaken her.  MRI shows degenerative changes at C5-C6 and C6-C7 but no definite nerve root compression.  Nerve conduction studies: Bilateral median motor responses had delayed distal latencies, right delayed more than left with normal amplitude and forearm conduction.  There could be a Martin-Gruber anastomosis involving the right arm.  Median sensory responses were delayed across the wrist, right worse than left with normal amplitudes.  The ulnar motor and sensory responses were normal bilaterally.  The left radial sensory response was normal.  Ulnar F-wave latencies were normal.  Electromyography: Needle EMG of selected muscles of the left arm of the right hand was performed.  In the left arm and shoulder, Two of the C7 innervated muscles had mild chronic denervation in a couple of the C6 innervated muscles had some polyphasic motor units but normal recruitment.  There was no abnormal spontaneous activity in any of the muscles tested.  The right APB muscle was normal.  Impression: This NCV/EMG study shows the following: 1.  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 2.  Minimal chronic left C7 radiculopathy without active features.  Kennedey Digilio A. Epimenio Foot, MD, PhD, FAAN Certified in Neurology, Clinical Neurophysiology, Sleep Medicine, Pain Medicine and Neuroimaging Director, [facility]  [facility] [address] [place], [place] [address] [phone]   Verbal informed consent was obtained from the patient, patient was informed of potential risk of procedure, including bruising, bleeding, hematoma formation, infection, muscle weakness, muscle pain, numbness, among others.         MNC    Nerve / Sites Muscle Latency Ref. Amplitude Ref. Rel Amp Segments Distance Velocity Ref. Area    ms ms mV mV %  cm m/s m/s mVms  L Median - APB     Wrist APB 5.1 ?4.4 6.1 ?4.0 100 Wrist - APB 7   21.0     Upper arm APB 8.8  6.0  99.6 Upper arm - Wrist 19 52 ?49 21.7  R Median - APB     Wrist APB 5.4 ?4.4 0.9 ?4.0 100 Wrist - APB 7   3.2     Upper arm APB 8.4  4.7  525 Upper arm - Wrist 18 60 ?49 16.4     Ulnar wrist APB 3.7  9.3  196 Ulnar wrist - Upper arm    29.8         Ulnar wrist - Wrist      L Ulnar - ADM     Wrist ADM 3.3 ?3.3 13.6 ?6.0 100 Wrist - ADM 7   50.9     B.Elbow ADM 5.7  12.6  92.2 B.Elbow - Wrist 15 61 ?49 51.8     A.Elbow ADM 7.4  12.8  102 A.Elbow - B.Elbow 10 61 ?49 58.3         A.Elbow - Wrist  R Ulnar - ADM     Wrist ADM 3.3 ?3.3 12.1 ?6.0 100 Wrist - ADM 7   42.9     B.Elbow ADM 6.0  11.6  95.9 B.Elbow - Wrist 16 59 ?49 41.4     A.Elbow ADM 7.8  12.1  104 A.Elbow - B.Elbow 10 59 ?49 42.7         A.Elbow - Wrist                 SNC    Nerve / Sites Rec. Site Peak Lat Ref.  Amp Ref. Segments Distance    ms ms V V  cm  L Radial - Anatomical snuff box (Forearm)     Forearm Wrist 2.7 ?2.9 31 ?15 Forearm - Wrist 10  L Median - Orthodromic (Dig II, Mid palm)     Dig II Wrist 4.2 ?3.4 13 ?10 Dig II - Wrist 13  R Median - Orthodromic (Dig II, Mid palm)     Dig II Wrist 5.3 ?3.4 11 ?10 Dig II - Wrist 13  L Ulnar - Orthodromic, (Dig V, Mid palm)     Dig V Wrist 3.1 ?3.1 9 ?5 Dig V - Wrist 11  R Ulnar - Orthodromic, (Dig V, Mid palm)     Dig V Wrist 3.0 ?3.1 12 ?5 Dig V - Wrist 41               F  Wave      Nerve F Lat Ref.   ms ms  L Ulnar - ADM 27.9 ?32.0  R Ulnar - ADM 27.0 ?32.0          EMG Summary Table    Spontaneous MUAP Recruitment  Muscle IA Fib PSW Fasc Other Amp Dur. Poly Pattern  L. Deltoid Normal None None None _______ Increased Normal 1+ Normal  L. Biceps brachii Normal None None None _______ Normal Normal Normal Normal  L. Flexor carpi ulnaris Normal None None None _______ Increased Normal 1+ Reduced  L. Triceps brachii Normal None None None _______ Normal Normal 1+ Reduced  L. First dorsal interosseous Normal None None None _______ Normal Normal Normal Normal  L. Extensor digitorum communis Normal None None None _______ Normal Normal Normal Normal  L. Abductor pollicis brevis Normal None None None _______ Normal Normal Normal Normal  L. Supraspinatus Normal None None None _______ Normal Normal 1+ Normal  L. Teres major Normal None None None _______ Normal Normal 1+ Normal  R. Abductor pollicis brevis Normal None None None _______ Normal Normal Normal Normal       [facility]  PATIENT: Brittney Hogan DOB: 1966-06-02  REFERRING DOCTOR OR PCP: Rueben Bash, PA-C (PCP); Joie Bimler, DC; Reino Bellis SOURCE: Patient, notes from Dr. Margaretha Sheffield, imaging report,Cervical spine x-ray reviewed.    _________________________________   HISTORICAL  CHIEF COMPLAINT:  Left  arm and shoulder pain and arm numbness and right hand and wrist pain and numbness  HISTORY OF PRESENT ILLNESS:  She is a 54 year old woman who has pain and numbness that radiates down the left arm.  She also has shoulder pain worse with movements.  There is pain and numbness in the right hand that has awakened her at times.  She had a MRI of the cervical spine 10/07/2020.  I personally reviewed the images.  C5-C6, there is mild to moderate right and mild left foraminal narrowing due to disc bulges and uncovertebral spurring.  There is no spinal stenosis or definite nerve root  compression.   C6-C7, there is disc protrusion slightly more to the right causing mild right foraminal narrowing but no nerve root compression or spinal stenosis.  NCV/EMG study performed 10/11/2020 shows: 1.  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 2.  Minimal chronic left C7 radiculopathy without active features.  REVIEW OF SYSTEMS: Remarkable for:  Constitutional: No fevers, chills, sweats, or change in appetite Musculoskeletal:Neck and shoulder pain as above Neurological: as above  ALLERGIES: Allergies  Allergen Reactions  . Sulfa Antibiotics Nausea And Vomiting  . Sulfamethoxazole Nausea And Vomiting    HOME MEDICATIONS:  Current Outpatient Medications:  .  ARMOUR THYROID 15 MG tablet, Take 15 mg by mouth every morning., Disp: , Rfl:  .  B Complex Vitamins (VITAMIN B-COMPLEX PO), Take 1 Dose by mouth daily., Disp: , Rfl:  .  CALCIUM PO, Take 1 Dose by mouth daily., Disp: , Rfl:  .  diclofenac (VOLTAREN) 75 MG EC tablet, Take 1 tablet (75 mg total) by mouth 2 (two) times daily as needed. Take with food., Disp: 30 tablet, Rfl: 0 .  gabapentin (NEURONTIN) 300 MG capsule, Take 1 capsule (300 mg total) by mouth 3 (three) times daily., Disp: 90 capsule, Rfl: 11 .  MAGNESIUM PO, Take 1 Dose by mouth daily., Disp: , Rfl:  .  MELATONIN PO, Take 15 mg by mouth at bedtime., Disp: , Rfl:  .  methylPREDNISolone (MEDROL) 4 MG tablet, Taper from 6 pills po for one day to 1 pill po the last day over 6 days, Disp: 21 tablet, Rfl: 0 .  Multiple Vitamins-Minerals (ZINC PO), Take 1 Dose by mouth daily., Disp: , Rfl:  .  progesterone (PROMETRIUM) 100 MG capsule, Take by mouth., Disp: , Rfl:  .  PSYLLIUM PO, Take 1 Dose by mouth daily., Disp: , Rfl:  .  TURMERIC PO, Take 1 Dose by mouth daily., Disp: , Rfl:  .  VITAMIN D PO, Take 1 Dose by mouth daily., Disp: , Rfl:   PAST MEDICAL HISTORY: History reviewed. No pertinent past medical  history.  PAST SURGICAL HISTORY: Past Surgical History:  Procedure Laterality Date  . SALPINGECTOMY  2009    FAMILY HISTORY: History reviewed. No pertinent family history.    PHYSICAL EXAM  General: The patient is well-developed and well-nourished and in no acute distress  HEENT:  Head is [place]/AT.    Musculoskeletal:  Reduced ROM left shoulder.   Tender over subacromial bursa and glenohumeral joint on the left.     Neurologic Exam  Mental status: The patient is alert and oriented x 3 at the time of the examination.  Speech is normal.  Cranial nerves: Extraocular movements are full.  Facial strength is normal  Motor:  Muscle bulk is normal.   Tone is normal. Strength is  5 / 5 in all 4 extremities except 4+/5 ulnar innervated left hand muscles and APB muscles bilaterally.    Sensory: Tinel's signs over both ulnar nerves at ulnar grooves, left > right.  Mildly reduced sensation over the hyperthenar eminences  Reflexes: Deep tendon reflexes are symmetric in arms     ASSESSMENT AND PLAN  Bilateral carpal tunnel syndrome  Left arm pain - Plan: NCV with EMG(electromyography)  Left arm numbness - Plan: NCV with EMG(electromyography)  Neck pain - Plan: NCV with EMG(electromyography)    1.  We discussed the results of the EMG/NCV study.  She is advised to wear splints.  If pain or  weakness worsens in the hands would recommend referral to orthopedics for carpal tunnel release. 2.   Pain in the left arm likely to a minimal C6-C7 radiculopathy, not severe enough to be referred to surgery.  She needs to complete her shoulder MRI and that may also shed light on to the source of shoulder pain. 3.   Continue NSAIDs as needed and add gabapentin 300 mg 3 times daily.  We can adjust the dose as needed. 4.   Return as needed for new or worsening neurologic symptoms.    Elfreda Blanchet A. Epimenio FootSater, MD, [facility],FAAN 10/11/2020, 9:47 AM Certified in Neurology, Clinical Neurophysiology, Sleep Medicine  and Neuroimaging  [facility] [address], [address] [place], [place] [id number] [phone]

## 2020-10-18 DIAGNOSIS — M67814 Other specified disorders of tendon, left shoulder: Secondary | ICD-10-CM | POA: Diagnosis not present

## 2020-10-18 DIAGNOSIS — M25512 Pain in left shoulder: Secondary | ICD-10-CM | POA: Diagnosis not present

## 2020-10-20 DIAGNOSIS — M67814 Other specified disorders of tendon, left shoulder: Secondary | ICD-10-CM | POA: Diagnosis not present

## 2020-10-20 DIAGNOSIS — M25512 Pain in left shoulder: Secondary | ICD-10-CM | POA: Diagnosis not present

## 2020-10-24 DIAGNOSIS — M67814 Other specified disorders of tendon, left shoulder: Secondary | ICD-10-CM | POA: Diagnosis not present

## 2020-10-24 DIAGNOSIS — M25512 Pain in left shoulder: Secondary | ICD-10-CM | POA: Diagnosis not present

## 2020-10-26 DIAGNOSIS — M67814 Other specified disorders of tendon, left shoulder: Secondary | ICD-10-CM | POA: Diagnosis not present

## 2020-10-26 DIAGNOSIS — M25512 Pain in left shoulder: Secondary | ICD-10-CM | POA: Diagnosis not present

## 2020-11-01 ENCOUNTER — Ambulatory Visit (INDEPENDENT_AMBULATORY_CARE_PROVIDER_SITE_OTHER): Payer: BC Managed Care – PPO | Admitting: Sports Medicine

## 2020-11-01 ENCOUNTER — Other Ambulatory Visit: Payer: Self-pay

## 2020-11-01 ENCOUNTER — Ambulatory Visit
Admission: RE | Admit: 2020-11-01 | Discharge: 2020-11-01 | Disposition: A | Discharge status: Home / Self Care | Payer: BC Managed Care – PPO | Source: Ambulatory Visit | Attending: Sports Medicine | Admitting: Sports Medicine

## 2020-11-01 ENCOUNTER — Other Ambulatory Visit: Payer: Self-pay | Admitting: Sports Medicine

## 2020-11-01 VITALS — BP 118/78 | Ht 62.0 in | Wt 158.0 lb

## 2020-11-01 DIAGNOSIS — M25512 Pain in left shoulder: Secondary | ICD-10-CM

## 2020-11-01 DIAGNOSIS — M67814 Other specified disorders of tendon, left shoulder: Secondary | ICD-10-CM | POA: Diagnosis not present

## 2020-11-01 DIAGNOSIS — S4992XA Unspecified injury of left shoulder and upper arm, initial encounter: Secondary | ICD-10-CM | POA: Diagnosis not present

## 2020-11-01 MED ORDER — DIAZEPAM 10 MG PO TABS: ORAL_TABLET | ORAL | 0 refills | Status: AC

## 2020-11-01 MED ORDER — DICLOFENAC SODIUM 75 MG PO TBEC: 75.0000 mg | DELAYED_RELEASE_TABLET | Freq: Two times a day (BID) | ORAL | 1 refills | Status: AC | PRN

## 2020-11-02 NOTE — Progress Notes (Signed)
   Subjective:    Patient ID: Brittney Hogan, female    DOB: August 01, 1966, 54 y.o.   MRN: [id number]  HPI   Patient comes in today for follow-up on left shoulder pain.  Since her last office visit with me, she has undergone a cervical spine MRI.  That study shows only minimal degenerative changes.  She has also undergone bilateral upper extremity EMG/nerve conduction studies which shows carpal tunnel syndrome, right greater than left.  Her main complaint continues to be diffuse anterior shoulder pain with activity.  Pain all began several years ago but was worsened earlier this year when she was carrying her daughter on her back and her daughter forcefully abducted her shoulder as she was falling.  Since then, she has had significant pain and limited range of motion.  She has had exhaustive physical therapy with [facility] and has noticed great improvement in her range of motion but still with significant anterior shoulder pain.  She notices weakness from time to time as well.  Difficulty sleeping on this side at night.   Review of Systems Above    Objective:   Physical Exam  Well-developed, well-nourished.  No acute distress  Left shoulder: Patient has much improved range of motion.  She has nearly full shoulder range of motion when compared to the uninvolved right shoulder.  However, she has significant pain with resisted supraspinatus and external rotation.  Positive O'Brien's as well.  Tenderness to palpation over the bicipital groove.  Neurovascular intact distally.  X-rays of her left shoulder including AP and axillary views show minimal degenerative changes at the glenohumeral joint.  Nothing acute. Bedside ultrasound done in August of this year at our office showed some possible rotator cuff tendinopathy but no obvious tear.      Assessment & Plan:   Chronic left shoulder pain worrisome for rotator cuff tear versus labral tear  Patient has failed exhaustive physical therapy to this  point.  Symptoms all began traumatically earlier this year.  Clinical concern is for either a rotator cuff or labral tear.  We will need to evaluate further with an MRI arthrogram.  Phone follow-up with those results when available and we will delineate a more definitive treatment plan based on those findings.  In the meantime I will refill her diclofenac to take as needed.  This does provide her with temporary pain relief.

## 2020-11-15 DIAGNOSIS — M67814 Other specified disorders of tendon, left shoulder: Secondary | ICD-10-CM | POA: Diagnosis not present

## 2020-11-15 DIAGNOSIS — M25512 Pain in left shoulder: Secondary | ICD-10-CM | POA: Diagnosis not present

## 2020-11-22 DIAGNOSIS — M25512 Pain in left shoulder: Secondary | ICD-10-CM | POA: Diagnosis not present

## 2020-11-22 DIAGNOSIS — M67814 Other specified disorders of tendon, left shoulder: Secondary | ICD-10-CM | POA: Diagnosis not present

## 2020-11-24 DIAGNOSIS — M25512 Pain in left shoulder: Secondary | ICD-10-CM | POA: Diagnosis not present

## 2020-11-24 DIAGNOSIS — M67814 Other specified disorders of tendon, left shoulder: Secondary | ICD-10-CM | POA: Diagnosis not present

## 2020-11-29 DIAGNOSIS — M25512 Pain in left shoulder: Secondary | ICD-10-CM | POA: Diagnosis not present

## 2020-11-29 DIAGNOSIS — M67814 Other specified disorders of tendon, left shoulder: Secondary | ICD-10-CM | POA: Diagnosis not present

## 2020-12-01 DIAGNOSIS — M25512 Pain in left shoulder: Secondary | ICD-10-CM | POA: Diagnosis not present

## 2020-12-01 DIAGNOSIS — M67814 Other specified disorders of tendon, left shoulder: Secondary | ICD-10-CM | POA: Diagnosis not present

## 2021-05-08 DIAGNOSIS — E039 Hypothyroidism, unspecified: Secondary | ICD-10-CM | POA: Diagnosis not present

## 2021-05-08 DIAGNOSIS — R799 Abnormal finding of blood chemistry, unspecified: Secondary | ICD-10-CM | POA: Diagnosis not present

## 2021-05-08 DIAGNOSIS — R7303 Prediabetes: Secondary | ICD-10-CM | POA: Diagnosis not present

## 2021-05-08 DIAGNOSIS — Z79890 Hormone replacement therapy (postmenopausal): Secondary | ICD-10-CM | POA: Diagnosis not present

## 2022-02-27 DIAGNOSIS — R799 Abnormal finding of blood chemistry, unspecified: Secondary | ICD-10-CM | POA: Diagnosis not present

## 2022-02-27 DIAGNOSIS — E279 Disorder of adrenal gland, unspecified: Secondary | ICD-10-CM | POA: Diagnosis not present

## 2022-02-27 DIAGNOSIS — R635 Abnormal weight gain: Secondary | ICD-10-CM | POA: Diagnosis not present

## 2022-02-27 DIAGNOSIS — Z79890 Hormone replacement therapy (postmenopausal): Secondary | ICD-10-CM | POA: Diagnosis not present

## 2022-02-27 DIAGNOSIS — E039 Hypothyroidism, unspecified: Secondary | ICD-10-CM | POA: Diagnosis not present

## 2022-02-27 DIAGNOSIS — E559 Vitamin D deficiency, unspecified: Secondary | ICD-10-CM | POA: Diagnosis not present

## 2022-02-27 DIAGNOSIS — R5383 Other fatigue: Secondary | ICD-10-CM | POA: Diagnosis not present

## 2022-02-27 DIAGNOSIS — R7303 Prediabetes: Secondary | ICD-10-CM | POA: Diagnosis not present

## 2022-02-27 DIAGNOSIS — E782 Mixed hyperlipidemia: Secondary | ICD-10-CM | POA: Diagnosis not present

## 2022-05-29 ENCOUNTER — Emergency Department (HOSPITAL_COMMUNITY)
Admission: EM | Admit: 2022-05-29 | Discharge: 2022-05-29 | Disposition: A | Discharge status: Home / Self Care | Payer: BC Managed Care – PPO | Attending: Emergency Medicine | Admitting: Emergency Medicine

## 2022-05-29 ENCOUNTER — Emergency Department (HOSPITAL_COMMUNITY): Payer: BC Managed Care – PPO

## 2022-05-29 ENCOUNTER — Other Ambulatory Visit: Payer: Self-pay

## 2022-05-29 ENCOUNTER — Encounter (HOSPITAL_COMMUNITY): Payer: Self-pay

## 2022-05-29 DIAGNOSIS — E039 Hypothyroidism, unspecified: Secondary | ICD-10-CM | POA: Diagnosis not present

## 2022-05-29 DIAGNOSIS — R42 Dizziness and giddiness: Secondary | ICD-10-CM | POA: Diagnosis not present

## 2022-05-29 DIAGNOSIS — R55 Syncope and collapse: Secondary | ICD-10-CM | POA: Diagnosis not present

## 2022-05-29 DIAGNOSIS — N9489 Other specified conditions associated with female genital organs and menstrual cycle: Secondary | ICD-10-CM | POA: Diagnosis not present

## 2022-05-29 HISTORY — DX: Disorder of thyroid, unspecified: E07.9

## 2022-05-29 LAB — URINALYSIS, ROUTINE W REFLEX MICROSCOPIC
Bacteria, UA: NONE SEEN
Bilirubin Urine: NEGATIVE
Glucose, UA: NEGATIVE mg/dL
Ketones, ur: NEGATIVE mg/dL
Leukocytes,Ua: NEGATIVE
Nitrite: NEGATIVE
Protein, ur: NEGATIVE mg/dL
Specific Gravity, Urine: 1.009 (ref 1.005–1.030)
pH: 5 (ref 5.0–8.0)

## 2022-05-29 LAB — COMPREHENSIVE METABOLIC PANEL
ALT: 35 U/L (ref 0–44)
AST: 28 U/L (ref 15–41)
Albumin: 3.7 g/dL (ref 3.5–5.0)
Alkaline Phosphatase: 62 U/L (ref 38–126)
Anion gap: 9 (ref 5–15)
BUN: 16 mg/dL (ref 6–20)
CO2: 23 mmol/L (ref 22–32)
Calcium: 9 mg/dL (ref 8.9–10.3)
Chloride: 106 mmol/L (ref 98–111)
Creatinine, Ser: 1.1 mg/dL — ABNORMAL HIGH (ref 0.44–1.00)
GFR, Estimated: 59 mL/min — ABNORMAL LOW (ref >60)
Glucose, Bld: 109 mg/dL — ABNORMAL HIGH (ref 70–99)
Potassium: 3.9 mmol/L (ref 3.5–5.1)
Sodium: 138 mmol/L (ref 135–145)
Total Bilirubin: 1 mg/dL (ref 0.3–1.2)
Total Protein: 6.6 g/dL (ref 6.5–8.1)

## 2022-05-29 LAB — CBC
HCT: 38 % (ref 36.0–46.0)
Hemoglobin: 12.7 g/dL (ref 12.0–15.0)
MCH: 29.4 pg (ref 26.0–34.0)
MCHC: 33.4 g/dL (ref 30.0–36.0)
MCV: 88 fL (ref 80.0–100.0)
Platelets: 285 10*3/uL (ref 150–400)
RBC: 4.32 MIL/uL (ref 3.87–5.11)
RDW: 13.7 % (ref 11.5–15.5)
WBC: 11.2 10*3/uL — ABNORMAL HIGH (ref 4.0–10.5)
nRBC: 0 % (ref 0.0–0.2)

## 2022-05-29 LAB — TROPONIN I (HIGH SENSITIVITY)
Troponin I (High Sensitivity): 3 ng/L (ref <18)
Troponin I (High Sensitivity): 3 ng/L (ref <18)

## 2022-05-29 LAB — I-STAT BETA HCG BLOOD, ED (MC, WL, AP ONLY): I-stat hCG, quantitative: <5 m[IU]/mL (ref <5)

## 2022-05-29 MED ORDER — ACETAMINOPHEN 325 MG PO TABS
650.0000 mg | ORAL_TABLET | Freq: Once | ORAL | Status: AC
Start: 2022-05-29 — End: 2022-05-29
  Administered 2022-05-29: 650 mg via ORAL
  Filled 2022-05-29: qty 2

## 2022-05-29 MED ORDER — LACTATED RINGERS IV BOLUS
1000.0000 mL | Freq: Once | INTRAVENOUS | Status: AC
  Administered 2022-05-29: 1000 mL via INTRAVENOUS

## 2022-05-29 NOTE — ED Provider Triage Note (Signed)
Emergency Medicine Provider Triage Evaluation Note  Brittney Hogan , a 56 y.o. female  was evaluated in triage.  Pt complains of presyncope.  Patient states that she was getting her car worked on around noon today.  She was sitting outside in the heat when she began to feel dizzy and as if she was going to pass out.  She proceeded to go inside where there is air conditioning and the dizziness got worse.  She denies loss of consciousness.  She has a history of vertigo after she had COVID for approximately 6 months but it ceased without medical intervention.  She still dizzy now.  She denies cardiac history including ACS, murmur, abnormal rhythm.  Denies fever, chills, night sweats, chest pain, shortness of breath, abdominal pain, nausea/vomiting/diarrhea, urinary/vaginal symptoms, change in bowel habits.  Review of Systems  Positive: See above Negative:   Physical Exam  BP 107/69 (BP Location: Right Arm)   Pulse 78   Temp 98.5 F (36.9 C) (Oral)   Resp 18   Ht 5\' 2"  (1.575 m)   Wt 77.1 kg   SpO2 95%   BMI 31.09 kg/m  Gen:   Awake, no distress   Resp:  Normal effort  MSK:   Moves extremities without difficulty  Other:  Systolic murmur noted right upper sternal border.  Coronary 3 through 12 grossly intact.  PERRLA.  EOMs intact bilaterally  Medical Decision Making  Medically screening exam initiated at 2:04 PM.  Appropriate orders placed.  Brittney Hogan was informed that the remainder of the evaluation will be completed by another provider, this initial triage assessment does not replace that evaluation, and the importance of remaining in the ED until their evaluation is complete.     Peter Garter, [place] 05/29/22 1406

## 2022-05-29 NOTE — ED Provider Notes (Signed)
[facility] EMERGENCY DEPARTMENT Provider Note   CSN: [id number] Arrival date & time: 05/29/22  1323     History Chief Complaint  Patient presents with   Near Syncope    Brittney Hogan is a 56 y.o. female with h/o hypothyroidism, hypoglycemia, HLD, and PCOS presents to the ED for evaluation after near syncopal event.    Near Syncope       Home Medications Prior to Admission medications   Medication Sig Start Date End Date Taking? Authorizing Provider  ADVIL 200 MG tablet Take 400-600 mg by mouth every 6 (six) hours as needed (for headaches or mild pain).   Yes [provider]  ARMOUR THYROID 30 MG tablet Take 30 mg by mouth daily before breakfast. 03/14/22  Yes [provider]  Melatonin 10 MG TABS Take 10 mg by mouth at bedtime as needed (for sleep).   Yes [provider]  diazepam (VALIUM) 10 MG tablet Take one hour prior to MRI. Patient not taking: Reported on 05/29/2022 11/01/20   Ralene Cork, DO  diclofenac (VOLTAREN) 75 MG EC tablet Take 1 tablet (75 mg total) by mouth 2 (two) times daily as needed. Patient not taking: Reported on 05/29/2022 11/01/20   Ralene Cork, DO  gabapentin (NEURONTIN) 300 MG capsule Take 1 capsule (300 mg total) by mouth 3 (three) times daily. Patient not taking: Reported on 05/29/2022 10/11/20   Asa Lente, MD  methylPREDNISolone (MEDROL) 4 MG tablet Taper from 6 pills po for one day to 1 pill po the last day over 6 days Patient not taking: Reported on 05/29/2022 09/27/20   Asa Lente, MD      Allergies    Sulfa antibiotics    Review of Systems   Review of Systems  Cardiovascular:  Positive for near-syncope.    Physical Exam Updated Vital Signs BP 123/68   Pulse 83   Temp 98.5 F (36.9 C) (Oral)   Resp 16   Ht 5\' 2"  (1.575 m)   Wt 77.1 kg   SpO2 96%   BMI 31.09 kg/m  Physical Exam  ED Results / Procedures / Treatments   Labs (all labs ordered are listed, but only  abnormal results are displayed) Labs Reviewed  CBC - Abnormal; Notable for the following components:      Result Value   WBC 11.2 (*)    All other components within normal limits  URINALYSIS, ROUTINE W REFLEX MICROSCOPIC - Abnormal; Notable for the following components:   Color, Urine STRAW (*)    Hgb urine dipstick MODERATE (*)    All other components within normal limits  COMPREHENSIVE METABOLIC PANEL - Abnormal; Notable for the following components:   Glucose, Bld 109 (*)    Creatinine, Ser 1.10 (*)    GFR, Estimated 59 (*)    All other components within normal limits  CBG MONITORING, ED  I-STAT BETA HCG BLOOD, ED ([facility], [facility], [facility] ONLY)  TROPONIN I (HIGH SENSITIVITY)  TROPONIN I (HIGH SENSITIVITY)    EKG None  Radiology DG Chest 2 View  Result Date: 05/29/2022 CLINICAL DATA:  Near syncope EXAM: CHEST - 2 VIEW COMPARISON:  None Available. FINDINGS: The heart size and mediastinal contours are within normal limits. Both lungs are clear. The visualized skeletal structures are unremarkable. IMPRESSION: No active cardiopulmonary disease. Electronically Signed   By: 05/31/2022 M.D.   On: 05/29/2022 18:05    Procedures Procedures   Medications Ordered in ED Medications  lactated  ringers bolus 1,000 mL (1,000 mLs Intravenous New Bag/Given 05/29/22 1726)  acetaminophen (TYLENOL) tablet 650 mg (650 mg Oral Given 05/29/22 1730)    ED Course/ Medical Decision Making/ A&P                           Medical Decision Making Amount and/or Complexity of Data Reviewed Labs: ordered. Radiology: ordered.  Risk OTC drugs.   ***  I discussed this case with my attending physician who cosigned this note including patient's presenting symptoms, physical exam, and planned diagnostics and interventions. Attending physician stated agreement with plan or made changes to plan which were implemented.   Final Clinical Impression(s) / ED Diagnoses Final diagnoses:  Near syncope    Rx /  DC Orders ED Discharge Orders     None
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
## 2022-11-04 DIAGNOSIS — J019 Acute sinusitis, unspecified: Secondary | ICD-10-CM | POA: Diagnosis not present

## 2022-11-04 DIAGNOSIS — J209 Acute bronchitis, unspecified: Secondary | ICD-10-CM | POA: Diagnosis not present

## 2023-11-18 ENCOUNTER — Encounter (HOSPITAL_COMMUNITY): Payer: Self-pay

## 2023-11-18 ENCOUNTER — Ambulatory Visit (HOSPITAL_COMMUNITY)
Admission: RE | Admit: 2023-11-18 | Discharge: 2023-11-18 | Disposition: A | Discharge status: Home / Self Care | Payer: BC Managed Care – PPO | Source: Ambulatory Visit | Attending: Internal Medicine | Admitting: Internal Medicine

## 2023-11-18 VITALS — BP 134/84 | HR 72 | Temp 98.4°F | Resp 18 | Ht 62.0 in | Wt 168.6 lb

## 2023-11-18 DIAGNOSIS — R052 Subacute cough: Secondary | ICD-10-CM

## 2023-11-18 DIAGNOSIS — J069 Acute upper respiratory infection, unspecified: Secondary | ICD-10-CM

## 2023-11-18 MED ORDER — BENZONATATE 100 MG PO CAPS: 100.0000 mg | ORAL_CAPSULE | Freq: Three times a day (TID) | ORAL | 0 refills | Status: AC

## 2023-11-18 MED ORDER — PREDNISONE 10 MG PO TABS: 40.0000 mg | ORAL_TABLET | Freq: Every day | ORAL | 0 refills | Status: AC

## 2023-11-18 NOTE — ED Triage Notes (Signed)
Sinus, headache, cough since Thanksgiving - Entered by patient"  Dry cough, Patient was sick first and now daughter is sick.   Patient took otc cold meds with little releif.

## 2023-11-18 NOTE — Discharge Instructions (Addendum)
Residual inflammation from recent upper respiratory infection. At current does not need antibiotics. Will treat with following:  Prednisone 40mg  daily for 5 days. Take in the AM. Benzonatate 100mg  every 8 hours as needed for cough.  Rest and hydrate Return to urgent care or PCP if symptoms worsen or fail to resolve.

## 2023-11-18 NOTE — ED Provider Notes (Signed)
[facility]    CSN: [id number] Arrival date & time: 11/18/23  1624      History   Chief Complaint Chief Complaint  Patient presents with   Cough   Appointment    HPI Brittney Hogan is a 57 y.o. female.   57 year old female who presents to urgent care with complaints of upper respiratory symptoms that began around Thanksgiving.  She reports that she was very sick during Thanksgiving with a fever that lasted 3 days, nasal congestion and severe cough.  This has persisted although now the symptoms are much more mild.  Now she has left ear pressure with sinus pressure and congestion and a very vague cough.  She denies fevers or chills now.  She is eating and drinking well.  She denies abdominal pain, urinary symptoms, shortness of breath, chest pain.     Cough Associated symptoms: headaches   Associated symptoms: no chest pain, no chills, no ear pain, no fever, no rash, no shortness of breath and no sore throat     Past Medical History:  Diagnosis Date   Thyroid disease     Patient Active Problem List   Diagnosis Date Noted   Bilateral carpal tunnel syndrome 10/11/2020   Left arm pain 09/27/2020   Left arm numbness 09/27/2020   Acute pain of left shoulder 09/27/2020   Neck pain 09/27/2020    Past Surgical History:  Procedure Laterality Date   SALPINGECTOMY  2009    OB History   No obstetric history on file.      Home Medications    Prior to Admission medications   Medication Sig Start Date End Date Taking? Authorizing Provider  ARMOUR THYROID 30 MG tablet Take 30 mg by mouth daily before breakfast. 03/14/22  Yes [provider]  ADVIL 200 MG tablet Take 400-600 mg by mouth every 6 (six) hours as needed (for headaches or mild pain).    [provider]  diazepam (VALIUM) 10 MG tablet Take one hour prior to MRI. Patient not taking: Reported on 05/29/2022 11/01/20   Ralene Cork, DO  diclofenac (VOLTAREN) 75 MG EC tablet Take 1 tablet  (75 mg total) by mouth 2 (two) times daily as needed. Patient not taking: Reported on 05/29/2022 11/01/20   Ralene Cork, DO  gabapentin (NEURONTIN) 300 MG capsule Take 1 capsule (300 mg total) by mouth 3 (three) times daily. Patient not taking: Reported on 05/29/2022 10/11/20   Asa Lente, MD  Melatonin 10 MG TABS Take 10 mg by mouth at bedtime as needed (for sleep).    [provider]  methylPREDNISolone (MEDROL) 4 MG tablet Taper from 6 pills po for one day to 1 pill po the last day over 6 days Patient not taking: Reported on 05/29/2022 09/27/20   Asa Lente, MD    Family History History reviewed. No pertinent family history.  Social History Social History   Tobacco Use   Smoking status: Never   Smokeless tobacco: Never  Vaping Use   Vaping status: Never Used  Substance Use Topics   Alcohol use: Never   Drug use: Never     Allergies   Sulfa antibiotics   Review of Systems Review of Systems  Constitutional:  Negative for chills and fever.  HENT:  Positive for congestion and sinus pressure. Negative for ear pain and sore throat.   Eyes:  Negative for pain and visual disturbance.  Respiratory:  Positive for cough. Negative for shortness of breath.  Cardiovascular:  Negative for chest pain and palpitations.  Gastrointestinal:  Negative for abdominal pain and vomiting.  Genitourinary:  Negative for dysuria and hematuria.  Musculoskeletal:  Negative for arthralgias and back pain.  Skin:  Negative for color change and rash.  Neurological:  Positive for headaches. Negative for seizures and syncope.  All other systems reviewed and are negative.    Physical Exam Triage Vital Signs ED Triage Vitals  Encounter Vitals Group     BP 11/18/23 1653 134/84     Systolic BP Percentile --      Diastolic BP Percentile --      Pulse Rate 11/18/23 1653 72     Resp 11/18/23 1653 18     Temp 11/18/23 1653 98.4 F (36.9 C)     Temp Source 11/18/23 1653 Oral      SpO2 11/18/23 1653 93 %     Weight 11/18/23 1653 168 lb 9.6 oz (76.5 kg)     Height 11/18/23 1653 5\' 2"  (1.575 m)     Head Circumference --      Peak Flow --      Pain Score 11/18/23 1652 0     Pain Loc --      Pain Education --      Exclude from Growth Chart --    No data found.  Updated Vital Signs BP 134/84 (BP Location: Left Arm)   Pulse 72   Temp 98.4 F (36.9 C) (Oral)   Resp 18   Ht 5\' 2"  (1.575 m)   Wt 168 lb 9.6 oz (76.5 kg)   LMP 01/31/2021 (Approximate)   SpO2 93%   BMI 30.84 kg/m   Visual Acuity Right Eye Distance:   Left Eye Distance:   Bilateral Distance:    Right Eye Near:   Left Eye Near:    Bilateral Near:     Physical Exam Vitals and nursing note reviewed.  Constitutional:      General: She is not in acute distress.    Appearance: She is well-developed.  HENT:     Head: Normocephalic and atraumatic.     Right Ear: Tympanic membrane normal.     Left Ear:  No middle ear effusion. Tympanic membrane is not injected or erythematous.     Ears:      Nose: Nose normal.     Mouth/Throat:     Mouth: Mucous membranes are moist.     Pharynx: Oropharynx is clear.  Eyes:     Conjunctiva/sclera: Conjunctivae normal.  Cardiovascular:     Rate and Rhythm: Normal rate and regular rhythm.     Heart sounds: No murmur heard. Pulmonary:     Effort: Pulmonary effort is normal. No respiratory distress.     Breath sounds: Normal breath sounds.  Abdominal:     Palpations: Abdomen is soft.     Tenderness: There is no abdominal tenderness.  Musculoskeletal:        General: No swelling.     Cervical back: Neck supple.  Skin:    General: Skin is warm and dry.     Capillary Refill: Capillary refill takes less than 2 seconds.  Neurological:     Mental Status: She is alert.  Psychiatric:        Mood and Affect: Mood normal.      UC Treatments / Results  Labs (all labs ordered are listed, but only abnormal results are displayed) Labs Reviewed - No data to  display  EKG   Radiology  No results found.  Procedures Procedures (including critical care time)  Medications Ordered in UC Medications - No data to display  Initial Impression / Assessment and Plan / UC Course  I have reviewed the triage vital signs and the nursing notes.  Pertinent labs & imaging results that were available during my care of the patient were reviewed by me and considered in my medical decision making (see chart for details).     Subacute cough  Upper respiratory tract infection, unspecified type   Residual inflammation from recent upper respiratory infection. At current does not need antibiotics as this is resolving. Will treat with following:  Prednisone 40mg  daily for 5 days. Take in the AM. Benzonatate 100mg  every 8 hours as needed for cough.  Rest and hydrate Return to urgent care or PCP if symptoms worsen or fail to resolve.    Final Clinical Impressions(s) / UC Diagnoses   Final diagnoses:  None   Discharge Instructions   None    ED Prescriptions   None    PDMP not reviewed this encounter.   Landis Martins, [place] 11/18/23 1733
